# Patient Record
Sex: Female | Born: 1990 | Race: Black or African American | Hispanic: No | Marital: Single | Smoking: Former smoker
Health system: Southern US, Community
[De-identification: ages and names within clinical notes are randomized; demographics above are authoritative.]

## PROBLEM LIST (undated history)

## (undated) DIAGNOSIS — A749 Chlamydial infection, unspecified: Secondary | ICD-10-CM

---

## 2011-02-21 ENCOUNTER — Inpatient Hospital Stay (INDEPENDENT_AMBULATORY_CARE_PROVIDER_SITE_OTHER)
Admission: RE | Admit: 2011-02-21 | Discharge: 2011-02-21 | Disposition: A | Discharge status: Home / Self Care | Source: Ambulatory Visit | Attending: Family Medicine | Admitting: Family Medicine

## 2011-02-21 DIAGNOSIS — G44209 Tension-type headache, unspecified, not intractable: Secondary | ICD-10-CM

## 2012-01-10 ENCOUNTER — Emergency Department (INDEPENDENT_AMBULATORY_CARE_PROVIDER_SITE_OTHER)
Admission: EM | Admit: 2012-01-10 | Discharge: 2012-01-10 | Disposition: A | Discharge status: Home / Self Care | Source: Home / Self Care | Attending: Emergency Medicine | Admitting: Emergency Medicine

## 2012-01-10 ENCOUNTER — Encounter (HOSPITAL_COMMUNITY): Payer: Self-pay | Admitting: Emergency Medicine

## 2012-01-10 DIAGNOSIS — J069 Acute upper respiratory infection, unspecified: Secondary | ICD-10-CM

## 2012-01-10 LAB — POCT PREGNANCY, URINE: Preg Test, Ur: NEGATIVE

## 2012-01-10 MED ORDER — BENZONATATE 200 MG PO CAPS: 200.0000 mg | ORAL_CAPSULE | Freq: Three times a day (TID) | ORAL | Status: AC | PRN

## 2012-01-10 MED ORDER — NAPROXEN 500 MG PO TABS: 500.0000 mg | ORAL_TABLET | Freq: Two times a day (BID) | ORAL | Status: DC

## 2012-01-10 MED ORDER — FEXOFENADINE-PSEUDOEPHED ER 60-120 MG PO TB12: 1.0000 | ORAL_TABLET | Freq: Two times a day (BID) | ORAL | Status: DC

## 2012-01-10 NOTE — ED Provider Notes (Signed)
Chief Complaint  Patient presents with  . Sore Throat    flu like symptoms    History of Present Illness:   The patient is a 21 year old female with a four-day history of sore throat, hoarseness, dry cough, chest tightness, chest pain, nasal congestion, headache, watery eyes, ringing in ears, chills, has felt feverish, weakness, malaise, diarrhea, and nausea.  Review of Systems:  Other than noted above, the patient denies any of the following symptoms. Systemic:  No fever, chills, sweats, fatigue, myalgias, headache, or anorexia. Eye:  No redness, pain or drainage. ENT:  No earache, ear congestion, nasal congestion, sneezing, rhinorrhea, sinus pressure, sinus pain, post nasal drip, or sore throat. Lungs:  No cough, sputum production, wheezing, shortness of breath, or chest pain. GI:  No abdominal pain, nausea, vomiting, or diarrhea.  PMFSH:  Past medical history, family history, social history, meds, and allergies were reviewed.  Physical Exam:   Vital signs:  BP 122/84  Pulse 98  Temp 98.1 F (36.7 C) (Oral)  Resp 16  SpO2 100% General:  Alert, in no distress. Eye:  No conjunctival injection or drainage. Lids were normal. ENT:  TMs and canals were normal, without erythema or inflammation.  Nasal mucosa was congested and, without drainage.  Mucous membranes were moist.  Pharynx was erythematous, without exudate or drainage.  There were no oral ulcerations or lesions. Neck:  Supple, no adenopathy, tenderness or mass. Lungs:  No respiratory distress.  Lungs were clear to auscultation, without wheezes, rales or rhonchi.  Breath sounds were clear and equal bilaterally.  Heart:  Regular rhythm, without gallops, murmers or rubs. Skin:  Clear, warm, and dry, without rash or lesions.  Labs:   Results for orders placed during the hospital encounter of 01/10/12  POCT PREGNANCY, URINE      Component Value Range   Preg Test, Ur NEGATIVE  NEGATIVE   Assessment:  The encounter diagnosis was  Viral upper respiratory infection.  Plan:   1.  The following meds were prescribed:   New Prescriptions   BENZONATATE (TESSALON) 200 MG CAPSULE    Take 1 capsule (200 mg total) by mouth 3 (three) times daily as needed for cough.   FEXOFENADINE-PSEUDOEPHEDRINE (ALLEGRA-D) 60-120 MG PER TABLET    Take 1 tablet by mouth every 12 (twelve) hours.   NAPROXEN (NAPROSYN) 500 MG TABLET    Take 1 tablet (500 mg total) by mouth 2 (two) times daily.   2.  The patient was instructed in symptomatic care and handouts were given. 3.  The patient was told to return if becoming worse in any way, if no better in 3 or 4 days, and given some red flag symptoms that would indicate earlier return.   Reuben Likes, MD 01/10/12 [phone]

## 2012-01-10 NOTE — ED Notes (Addendum)
Pt c/o sore throat/cough since Monday with flu like symptoms/hot cold chills and diarrhea x 3 days. Denies n/v. Pt states she has tried mucinex and nyquil with no relief of symptoms.

## 2012-07-22 ENCOUNTER — Emergency Department (HOSPITAL_COMMUNITY)

## 2012-07-22 ENCOUNTER — Encounter (HOSPITAL_COMMUNITY): Payer: Self-pay | Admitting: *Deleted

## 2012-07-22 ENCOUNTER — Emergency Department (HOSPITAL_COMMUNITY)
Admission: EM | Admit: 2012-07-22 | Discharge: 2012-07-22 | Disposition: A | Discharge status: Home / Self Care | Attending: Emergency Medicine | Admitting: Emergency Medicine

## 2012-07-22 DIAGNOSIS — O200 Threatened abortion: Secondary | ICD-10-CM

## 2012-07-22 DIAGNOSIS — Z3201 Encounter for pregnancy test, result positive: Secondary | ICD-10-CM | POA: Insufficient documentation

## 2012-07-22 DIAGNOSIS — R300 Dysuria: Secondary | ICD-10-CM | POA: Insufficient documentation

## 2012-07-22 DIAGNOSIS — R110 Nausea: Secondary | ICD-10-CM | POA: Insufficient documentation

## 2012-07-22 DIAGNOSIS — Z792 Long term (current) use of antibiotics: Secondary | ICD-10-CM | POA: Insufficient documentation

## 2012-07-22 DIAGNOSIS — O9989 Other specified diseases and conditions complicating pregnancy, childbirth and the puerperium: Secondary | ICD-10-CM | POA: Insufficient documentation

## 2012-07-22 DIAGNOSIS — Z3490 Encounter for supervision of normal pregnancy, unspecified, unspecified trimester: Secondary | ICD-10-CM

## 2012-07-22 LAB — URINALYSIS, ROUTINE W REFLEX MICROSCOPIC
Ketones, ur: 15 mg/dL — AB
Nitrite: NEGATIVE
Protein, ur: NEGATIVE mg/dL
pH: 6.5 (ref 5.0–8.0)

## 2012-07-22 LAB — URINE MICROSCOPIC-ADD ON

## 2012-07-22 LAB — CBC WITH DIFFERENTIAL/PLATELET
HCT: 37.6 % (ref 36.0–46.0)
Hemoglobin: 13.3 g/dL (ref 12.0–15.0)
Lymphocytes Relative: 13 % (ref 12–46)
Lymphs Abs: 1.3 10*3/uL (ref 0.7–4.0)
MCHC: 35.4 g/dL (ref 30.0–36.0)
Monocytes Absolute: 1 10*3/uL (ref 0.1–1.0)
Monocytes Relative: 10 % (ref 3–12)
Neutro Abs: 7.5 10*3/uL (ref 1.7–7.7)
Neutrophils Relative %: 75 % (ref 43–77)
RBC: 4.1 MIL/uL (ref 3.87–5.11)
WBC: 10.1 10*3/uL (ref 4.0–10.5)

## 2012-07-22 LAB — WET PREP, GENITAL
Clue Cells Wet Prep HPF POC: NONE SEEN
Trich, Wet Prep: NONE SEEN

## 2012-07-22 LAB — ABO/RH: ABO/RH(D): B POS

## 2012-07-22 MED ORDER — ONDANSETRON 4 MG PO TBDP
4.0000 mg | ORAL_TABLET | Freq: Three times a day (TID) | ORAL | Status: DC | PRN
Start: 2012-07-22 — End: 2013-07-16

## 2012-07-22 MED ORDER — AZITHROMYCIN 1 G PO PACK
1.0000 g | PACK | Freq: Once | ORAL | Status: AC
  Administered 2012-07-22: 1 g via ORAL
  Filled 2012-07-22: qty 1

## 2012-07-22 MED ORDER — ONDANSETRON 4 MG PO TBDP
4.0000 mg | ORAL_TABLET | Freq: Once | ORAL | Status: AC
  Administered 2012-07-22: 4 mg via ORAL
  Filled 2012-07-22: qty 1

## 2012-07-22 MED ORDER — LIDOCAINE HCL (PF) 1 % IJ SOLN
INTRAMUSCULAR | Status: AC
  Administered 2012-07-22: 17:00:00
  Filled 2012-07-22: qty 5

## 2012-07-22 MED ORDER — ONDANSETRON HCL 4 MG/2ML IJ SOLN
4.0000 mg | Freq: Once | INTRAMUSCULAR | Status: DC
  Filled 2012-07-22: qty 2

## 2012-07-22 MED ORDER — CEFTRIAXONE SODIUM 250 MG IJ SOLR
250.0000 mg | Freq: Once | INTRAMUSCULAR | Status: AC
  Administered 2012-07-22: 250 mg via INTRAMUSCULAR
  Filled 2012-07-22: qty 250

## 2012-07-22 NOTE — ED Notes (Addendum)
Pt jumped out of bed and noticed small brown-red clumps of what she thinks is blood.  Pt now wearing panty-liner.  Pt is being tx for chlamydia and a bacterial vaginosis.  Pt stopped taking antibiotic last night b/c it was giving her yeast-infection-like s/s.  Denies abd pain/cramping.

## 2012-07-22 NOTE — ED Provider Notes (Signed)
History     CSN: [id number]  Arrival date & time 07/22/12  1258   First MD Initiated Contact with Patient 07/22/12 1311      Chief Complaint  Patient presents with  . Vaginal Bleeding    [redacted] weeks pregnant   HPI  22 y/o G1P0 female with no significant PMH who presents with cc of vaginal bleeding. Symptoms began this morning. She states she has noticed reddish-brown clumps as well as "white streaks". She has used one panty liner. She is currently being treated for "chlamydia" and was taking amoxicillin until today when she stopped taking it because she was having symptoms of a yeast infection. She denies abdominal pain or cramping. She denies recent intercourse. LMP- 05/20/12  History reviewed. No pertinent past medical history.  History reviewed. No pertinent past surgical history.  Family History  Problem Relation Age of Onset  . Diabetes Other     History  Substance Use Topics  . Smoking status: Former Games developer  . Smokeless tobacco: Not on file  . Alcohol Use: 0.0 oz/week    1-2 Glasses of wine per week     Comment: not while pregnant    OB History   Grav Para Term Preterm Abortions TAB SAB Ect Mult Living                 Review of Systems  Constitutional: Negative for fever and chills.  HENT: Negative for congestion and rhinorrhea.   Respiratory: Negative for shortness of breath.   Cardiovascular: Negative for chest pain.  Gastrointestinal: Positive for nausea. Negative for vomiting and abdominal pain.  Genitourinary: Positive for dysuria, vaginal bleeding and vaginal discharge. Negative for frequency.  Skin: Negative for rash.  All other systems reviewed and are negative.   Allergies  Zithromax  Home Medications   Current Outpatient Rx  Name  Route  Sig  Dispense  Refill  . amoxicillin (AMOXIL) 500 MG capsule   Oral   Take 500 mg by mouth 3 (three) times daily. For 7 days. Started 07/16/2012         . metroNIDAZOLE (FLAGYL) 500 MG tablet   Oral   Take  500 mg by mouth 2 (two) times daily.         . Prenatal Vit-Fe Fumarate-FA (MULTIVITAMIN-PRENATAL) 27-0.8 MG TABS   Oral   Take 1 tablet by mouth daily at 12 noon.           BP 136/76  Pulse 90  Temp(Src) 97.4 F (36.3 C) (Oral)  Resp 18  SpO2 96%  LMP 05/20/2012  Physical Exam  Nursing note and vitals reviewed. Constitutional: She is oriented to person, place, and time. She appears well-developed and well-nourished. No distress.  HENT:  Head: Normocephalic and atraumatic.  Mouth/Throat: No oropharyngeal exudate.  Eyes: Conjunctivae are normal. Pupils are equal, round, and reactive to light.  Neck: Normal range of motion. Neck supple.  Cardiovascular: Normal rate.  Exam reveals no gallop and no friction rub.   No murmur heard. Pulmonary/Chest: Effort normal and breath sounds normal.  Abdominal: Soft. Bowel sounds are normal.  Genitourinary: Cervix exhibits no motion tenderness and no discharge. Right adnexum displays no mass and no tenderness. Left adnexum displays no mass and no tenderness. There is bleeding around the vagina. No tenderness around the vagina.  Pelvic performed in presence of nurse chaperone. Mild uterine tenderness. Os closed. Scant blood in vaginal vault.   Musculoskeletal: She exhibits no edema and no tenderness.  Neurological: She is  alert and oriented to person, place, and time.  Skin: Skin is warm and dry.  Psychiatric: She has a normal mood and affect.    ED Course  Procedures (including critical care time)  Labs Reviewed  WET PREP, GENITAL - Abnormal; Notable for the following:    Yeast Wet Prep HPF POC FEW (*)    WBC, Wet Prep HPF POC TOO NUMEROUS TO COUNT (*)    All other components within normal limits  URINALYSIS, ROUTINE W REFLEX MICROSCOPIC - Abnormal; Notable for the following:    Color, Urine AMBER (*)    Specific Gravity, Urine 1.031 (*)    Bilirubin Urine SMALL (*)    Ketones, ur 15 (*)    Leukocytes, UA SMALL (*)    All other  components within normal limits  GC/CHLAMYDIA PROBE AMP  URINE CULTURE  CBC WITH DIFFERENTIAL  URINE MICROSCOPIC-ADD ON  HCG, QUANTITATIVE, PREGNANCY  ABO/RH   US Ob Comp Less 14 Wks  07/22/2012  *RADIOLOGY REPORT*  Clinical Data: Pregnant, vaginal bleeding  OBSTETRIC <14 WK [place] AND TRANSVAGINAL OB US  Technique:  Both transabdominal and transvaginal ultrasound examinations were performed for complete evaluation of the gestation as well as the maternal uterus, adnexal regions, and pelvic cul-de-sac.  Transvaginal technique was performed to assess early pregnancy.  Comparison:  None.  Intrauterine gestational sac:  Visualized/normal in shape. Yolk sac: Present Embryo: Present Cardiac Activity: Present Heart Rate: 189 bpm  CRL: 21  mm  8 w  5 d         [place] EDC: 02/26/2013  Maternal uterus/adnexae: No subchronic hemorrhage.  Suspected 3.4 x 2.6 x 2.8 cm subserosal uterine body fibroid.  Right ovary is within normal limits transabdominally, measuring 1.3 x 2.7 x 0.9 cm.  Left ovary measures 2.1 x 2.8 x 2.0 cm and is notable for a 2.0 cm corpus luteal cyst.  No free fluid.  IMPRESSION: Single live intrauterine gestation with estimated gestational age [redacted] weeks 5 days by crown-rump length.   Original Report Authenticated By: Charline Bills, M.D.    US Ob Transvaginal  07/22/2012  *RADIOLOGY REPORT*  Clinical Data: Pregnant, vaginal bleeding  OBSTETRIC <14 WK [place] AND TRANSVAGINAL OB US  Technique:  Both transabdominal and transvaginal ultrasound examinations were performed for complete evaluation of the gestation as well as the maternal uterus, adnexal regions, and pelvic cul-de-sac.  Transvaginal technique was performed to assess early pregnancy.  Comparison:  None.  Intrauterine gestational sac:  Visualized/normal in shape. Yolk sac: Present Embryo: Present Cardiac Activity: Present Heart Rate: 189 bpm  CRL: 21  mm  8 w  5 d         [place] EDC: 02/26/2013  Maternal uterus/adnexae: No subchronic hemorrhage.   Suspected 3.4 x 2.6 x 2.8 cm subserosal uterine body fibroid.  Right ovary is within normal limits transabdominally, measuring 1.3 x 2.7 x 0.9 cm.  Left ovary measures 2.1 x 2.8 x 2.0 cm and is notable for a 2.0 cm corpus luteal cyst.  No free fluid.  IMPRESSION: Single live intrauterine gestation with estimated gestational age [redacted] weeks 5 days by crown-rump length.   Original Report Authenticated By: Charline Bills, M.D.     No diagnosis found.  MDM   22 y/o G1P0 female with no significant PMH who presents with cc of vaginal bleeding. HDS. Currently [redacted] weeks pregnant. Exam concerning for possible SAB. Will obtain US, CBC, and RH status.   [place] with live IUP. Hemoglobin stable. RH positive. UA  not c/w UTI but is currently on amoxicillin. The patient states she was told she had chlamydia but states she never received a shot or azithromycin. Because the patient states she was told she had chlamydia will empirically treat at this time. No evidence of PID on exam. The patient was given 1 g of azithromycin and 250 mg of rocephin. The patient was instructed to follow up with her ob/gyn.        Shanon Ace, MD 07/23/12 [phone]

## 2012-07-23 LAB — GC/CHLAMYDIA PROBE AMP
CT Probe RNA: POSITIVE — AB
GC Probe RNA: NEGATIVE

## 2012-07-23 LAB — URINE CULTURE: Colony Count: NO GROWTH

## 2012-07-25 NOTE — ED Notes (Signed)
+  Chlamydia. Patient treated with Rocephin and Zithromax. [employer] faxed. 

## 2012-07-26 ENCOUNTER — Telehealth (HOSPITAL_COMMUNITY): Payer: Self-pay | Admitting: Emergency Medicine

## 2012-07-26 NOTE — ED Notes (Signed)
Unable to reach patient via phone. Sent letter.

## 2012-07-26 NOTE — ED Provider Notes (Signed)
I have personally seen and examined the patient.  I have discussed the plan of care with the resident.  I have reviewed the documentation on PMH/FH/Soc. History.  I have reviewed the documentation of the resident and agree.   Pt well appearing, abdomen soft on my exam.   Will need outpatient GYN followup Stable for d/c  Joya Gaskins, MD 07/26/12 [phone]

## 2012-08-12 ENCOUNTER — Telehealth (HOSPITAL_COMMUNITY): Payer: Self-pay | Admitting: Emergency Medicine

## 2012-08-12 NOTE — ED Notes (Signed)
Pt calling after receiving letter requesting callback.  ID verified x 2.  Pt informed of dx, tx rvcd approp. notify partner(s) for testing and tx and abstain from sex 2 wks post tx.

## 2013-07-16 ENCOUNTER — Encounter (HOSPITAL_COMMUNITY): Payer: Self-pay | Admitting: Emergency Medicine

## 2013-07-16 ENCOUNTER — Emergency Department (HOSPITAL_COMMUNITY)
Admission: EM | Admit: 2013-07-16 | Discharge: 2013-07-16 | Disposition: A | Discharge status: Hospice | Source: Home / Self Care | Attending: Family Medicine | Admitting: Family Medicine

## 2013-07-16 ENCOUNTER — Emergency Department (HOSPITAL_COMMUNITY)
Admission: EM | Admit: 2013-07-16 | Discharge: 2013-07-16 | Discharge status: Against Medical Advice | Attending: Emergency Medicine | Admitting: Emergency Medicine

## 2013-07-16 DIAGNOSIS — R197 Diarrhea, unspecified: Secondary | ICD-10-CM | POA: Insufficient documentation

## 2013-07-16 DIAGNOSIS — R1011 Right upper quadrant pain: Secondary | ICD-10-CM | POA: Insufficient documentation

## 2013-07-16 HISTORY — DX: Chlamydial infection, unspecified: A74.9

## 2013-07-16 LAB — CBC WITH DIFFERENTIAL/PLATELET
BASOS PCT: 1 % (ref 0–1)
Basophils Absolute: 0.1 10*3/uL (ref 0.0–0.1)
Eosinophils Absolute: 0.2 10*3/uL (ref 0.0–0.7)
Eosinophils Relative: 2 % (ref 0–5)
HCT: 37.2 % (ref 36.0–46.0)
Hemoglobin: 13.1 g/dL (ref 12.0–15.0)
LYMPHS PCT: 22 % (ref 12–46)
Lymphs Abs: 2.4 10*3/uL (ref 0.7–4.0)
MCH: 33.4 pg (ref 26.0–34.0)
MCHC: 35.2 g/dL (ref 30.0–36.0)
MCV: 94.9 fL (ref 78.0–100.0)
Monocytes Absolute: 1.4 10*3/uL — ABNORMAL HIGH (ref 0.1–1.0)
Monocytes Relative: 13 % — ABNORMAL HIGH (ref 3–12)
NEUTROS ABS: 6.9 10*3/uL (ref 1.7–7.7)
NEUTROS PCT: 64 % (ref 43–77)
PLATELETS: 339 10*3/uL (ref 150–400)
RBC: 3.92 MIL/uL (ref 3.87–5.11)
RDW: 12.3 % (ref 11.5–15.5)
WBC: 10.9 10*3/uL — AB (ref 4.0–10.5)

## 2013-07-16 LAB — POCT PREGNANCY, URINE: Preg Test, Ur: NEGATIVE

## 2013-07-16 LAB — POCT URINALYSIS DIP (DEVICE)
BILIRUBIN URINE: NEGATIVE
Glucose, UA: NEGATIVE mg/dL
Hgb urine dipstick: NEGATIVE
Ketones, ur: 40 mg/dL — AB
NITRITE: NEGATIVE
PH: 5.5 (ref 5.0–8.0)
PROTEIN: NEGATIVE mg/dL
Specific Gravity, Urine: >=1.03 (ref 1.005–1.030)
UROBILINOGEN UA: 0.2 mg/dL (ref 0.0–1.0)

## 2013-07-16 LAB — COMPREHENSIVE METABOLIC PANEL
ALK PHOS: 69 U/L (ref 39–117)
ALT: 18 U/L (ref 0–35)
AST: 18 U/L (ref 0–37)
Albumin: 3.7 g/dL (ref 3.5–5.2)
BILIRUBIN TOTAL: 0.7 mg/dL (ref 0.3–1.2)
BUN: 9 mg/dL (ref 6–23)
CO2: 24 meq/L (ref 19–32)
Calcium: 9.6 mg/dL (ref 8.4–10.5)
Chloride: 99 mEq/L (ref 96–112)
Creatinine, Ser: 0.64 mg/dL (ref 0.50–1.10)
GLUCOSE: 84 mg/dL (ref 70–99)
POTASSIUM: 3.9 meq/L (ref 3.7–5.3)
SODIUM: 141 meq/L (ref 137–147)
Total Protein: 7.9 g/dL (ref 6.0–8.3)

## 2013-07-16 LAB — LIPASE, BLOOD: LIPASE: 24 U/L (ref 11–59)

## 2013-07-16 NOTE — ED Notes (Signed)
Pt updated on delay; family understanding. Revitaled.

## 2013-07-16 NOTE — ED Provider Notes (Addendum)
CSN: [id number]     Arrival date & time 07/16/13  1707 History   None    Chief Complaint  Patient presents with  . Abdominal Pain   (Consider location/radiation/quality/duration/timing/severity/associated sxs/prior Treatment) Patient is a 23 y.o. female presenting with abdominal pain. The history is provided by the patient.  Abdominal Pain Pain location:  RUQ Pain quality: cramping and shooting   Pain radiates to:  Does not radiate Pain severity:  Moderate Onset quality:  Gradual Duration:  2 weeks Timing:  Constant Chronicity:  New Context comment:  5 mo postpartum Associated symptoms: diarrhea   Associated symptoms: no nausea, no vaginal bleeding, no vaginal discharge and no vomiting   Associated symptoms comment:  Recent rx for bv.- resolved.   Past Medical History  Diagnosis Date  . Chlamydia    History reviewed. No pertinent past surgical history. Family History  Problem Relation Age of Onset  . Diabetes Other    History  Substance Use Topics  . Smoking status: Former Games developer  . Smokeless tobacco: Not on file  . Alcohol Use: 0.0 oz/week    1-2 Glasses of wine per week     Comment: occ.   OB History   Grav Para Term Preterm Abortions TAB SAB Ect Mult Living                 Review of Systems  Constitutional: Negative.   Gastrointestinal: Positive for abdominal pain and diarrhea. Negative for nausea and vomiting.  Genitourinary: Negative for vaginal bleeding, vaginal discharge and vaginal pain.    Allergies  Zithromax  Home Medications   Current Outpatient Rx  Name  Route  Sig  Dispense  Refill  . doxycycline (MONODOX) 100 MG capsule   Oral   Take 100 mg by mouth 2 (two) times daily.         . metroNIDAZOLE (FLAGYL) 500 MG tablet   Oral   Take 500 mg by mouth 2 (two) times daily.         . Norethindrone-Ethinyl Estradiol-Fe Biphas (LO LOESTRIN FE) 1 MG-10 MCG / 10 MCG tablet   Oral   Take 1 tablet by mouth daily.         . Prenatal Vit-Fe  Fumarate-FA (MULTIVITAMIN-PRENATAL) 27-0.8 MG TABS   Oral   Take 1 tablet by mouth daily at 12 noon.          BP 129/81  Pulse 108  Temp(Src) 98.4 F (36.9 C) (Oral)  Resp 18  SpO2 97%  LMP 06/16/2013 Physical Exam  Nursing note and vitals reviewed. Constitutional: She is oriented to person, place, and time. She appears well-developed and well-nourished.  HENT:  Mouth/Throat: Oropharynx is clear and moist.  Neck: Normal range of motion. Neck supple.  Abdominal: Soft. She exhibits no distension and no mass. Bowel sounds are decreased. There is no hepatosplenomegaly. There is tenderness in the right upper quadrant. There is positive Murphy's sign. There is no rigidity, no rebound, no guarding, no CVA tenderness and no tenderness at McBurney's point.    Lymphadenopathy:    She has no cervical adenopathy.  Neurological: She is alert and oriented to person, place, and time.  Skin: Skin is warm and dry.    ED Course  Procedures (including critical care time) Labs Review Labs Reviewed  POCT URINALYSIS DIP (DEVICE) - Abnormal; Notable for the following:    Ketones, ur 40 (*)    Leukocytes, UA TRACE (*)    All other components within normal limits  POCT PREGNANCY, URINE   Imaging Review No results found.   MDM   1. RUQ abdominal pain        Linna HoffJames D Bekki Tavenner, MD 07/16/13 1816  Linna HoffJames D Neda Willenbring, MD 07/16/13 2049

## 2013-07-16 NOTE — ED Notes (Addendum)
Pt c/o pain in RUQ than started 10 days ago.  Pt states pain is constant, worse at night.  Pt stats she can't sleep or lay on L side- must lay on right.  Pt states pain worsens with deep inhalation. Pt states diarrhea x3days

## 2013-07-16 NOTE — ED Notes (Signed)
C/o abd pain for a week  States she has more pain at night when she lays down Admits to having a STD States the pain is only on right side States she is having diarrhea

## 2013-07-17 ENCOUNTER — Encounter (HOSPITAL_COMMUNITY): Payer: Self-pay | Admitting: Emergency Medicine

## 2013-07-17 ENCOUNTER — Emergency Department (HOSPITAL_COMMUNITY)

## 2013-07-17 ENCOUNTER — Emergency Department (HOSPITAL_COMMUNITY)
Admission: EM | Admit: 2013-07-17 | Discharge: 2013-07-17 | Disposition: A | Discharge status: Home / Self Care | Attending: Emergency Medicine | Admitting: Emergency Medicine

## 2013-07-17 DIAGNOSIS — R109 Unspecified abdominal pain: Secondary | ICD-10-CM

## 2013-07-17 DIAGNOSIS — N898 Other specified noninflammatory disorders of vagina: Secondary | ICD-10-CM | POA: Insufficient documentation

## 2013-07-17 DIAGNOSIS — R197 Diarrhea, unspecified: Secondary | ICD-10-CM | POA: Insufficient documentation

## 2013-07-17 DIAGNOSIS — R110 Nausea: Secondary | ICD-10-CM | POA: Insufficient documentation

## 2013-07-17 DIAGNOSIS — R1011 Right upper quadrant pain: Secondary | ICD-10-CM | POA: Insufficient documentation

## 2013-07-17 DIAGNOSIS — Z87891 Personal history of nicotine dependence: Secondary | ICD-10-CM | POA: Insufficient documentation

## 2013-07-17 DIAGNOSIS — Z3202 Encounter for pregnancy test, result negative: Secondary | ICD-10-CM | POA: Insufficient documentation

## 2013-07-17 DIAGNOSIS — R1032 Left lower quadrant pain: Secondary | ICD-10-CM | POA: Insufficient documentation

## 2013-07-17 DIAGNOSIS — Z79899 Other long term (current) drug therapy: Secondary | ICD-10-CM | POA: Insufficient documentation

## 2013-07-17 DIAGNOSIS — Z8619 Personal history of other infectious and parasitic diseases: Secondary | ICD-10-CM | POA: Insufficient documentation

## 2013-07-17 LAB — COMPREHENSIVE METABOLIC PANEL
ALK PHOS: 73 U/L (ref 39–117)
ALT: 18 U/L (ref 0–35)
AST: 19 U/L (ref 0–37)
Albumin: 3.6 g/dL (ref 3.5–5.2)
BILIRUBIN TOTAL: 0.9 mg/dL (ref 0.3–1.2)
BUN: 9 mg/dL (ref 6–23)
CO2: 26 mEq/L (ref 19–32)
Calcium: 9.8 mg/dL (ref 8.4–10.5)
Chloride: 102 mEq/L (ref 96–112)
Creatinine, Ser: 0.7 mg/dL (ref 0.50–1.10)
GFR calc Af Amer: >90 mL/min (ref >90)
GFR calc non Af Amer: >90 mL/min (ref >90)
Glucose, Bld: 91 mg/dL (ref 70–99)
Potassium: 4.3 mEq/L (ref 3.7–5.3)
SODIUM: 143 meq/L (ref 137–147)
Total Protein: 8.5 g/dL — ABNORMAL HIGH (ref 6.0–8.3)

## 2013-07-17 LAB — URINALYSIS, ROUTINE W REFLEX MICROSCOPIC
Glucose, UA: NEGATIVE mg/dL
Hgb urine dipstick: NEGATIVE
Ketones, ur: 15 mg/dL — AB
NITRITE: POSITIVE — AB
PROTEIN: 30 mg/dL — AB
Specific Gravity, Urine: 1.041 — ABNORMAL HIGH (ref 1.005–1.030)
Urobilinogen, UA: 1 mg/dL (ref 0.0–1.0)
pH: 5.5 (ref 5.0–8.0)

## 2013-07-17 LAB — CBC WITH DIFFERENTIAL/PLATELET
BASOS PCT: 0 % (ref 0–1)
Basophils Absolute: 0 10*3/uL (ref 0.0–0.1)
EOS ABS: 0.1 10*3/uL (ref 0.0–0.7)
Eosinophils Relative: 2 % (ref 0–5)
HCT: 40.5 % (ref 36.0–46.0)
HEMOGLOBIN: 13.9 g/dL (ref 12.0–15.0)
LYMPHS ABS: 1 10*3/uL (ref 0.7–4.0)
Lymphocytes Relative: 13 % (ref 12–46)
MCH: 32.6 pg (ref 26.0–34.0)
MCHC: 34.3 g/dL (ref 30.0–36.0)
MCV: 94.8 fL (ref 78.0–100.0)
MONOS PCT: 12 % (ref 3–12)
Monocytes Absolute: 0.8 10*3/uL (ref 0.1–1.0)
NEUTROS ABS: 5.2 10*3/uL (ref 1.7–7.7)
NEUTROS PCT: 73 % (ref 43–77)
PLATELETS: 363 10*3/uL (ref 150–400)
RBC: 4.27 MIL/uL (ref 3.87–5.11)
RDW: 12.3 % (ref 11.5–15.5)
WBC: 7.1 10*3/uL (ref 4.0–10.5)

## 2013-07-17 LAB — LIPASE, BLOOD: Lipase: 29 U/L (ref 11–59)

## 2013-07-17 LAB — URINE MICROSCOPIC-ADD ON

## 2013-07-17 LAB — WET PREP, GENITAL
CLUE CELLS WET PREP: NONE SEEN
Trich, Wet Prep: NONE SEEN
Yeast Wet Prep HPF POC: NONE SEEN

## 2013-07-17 LAB — POC URINE PREG, ED: PREG TEST UR: NEGATIVE

## 2013-07-17 MED ORDER — PANTOPRAZOLE SODIUM 20 MG PO TBEC: 20.0000 mg | DELAYED_RELEASE_TABLET | Freq: Every day | ORAL | Status: AC

## 2013-07-17 MED ORDER — DOXYCYCLINE HYCLATE 100 MG PO CAPS: 100.0000 mg | ORAL_CAPSULE | Freq: Two times a day (BID) | ORAL | Status: AC

## 2013-07-17 MED ORDER — GI COCKTAIL ~~LOC~~: 30.0000 mL | Freq: Once | ORAL | Status: DC

## 2013-07-17 MED ORDER — OXYCODONE-ACETAMINOPHEN 5-325 MG PO TABS
2.0000 | ORAL_TABLET | Freq: Once | ORAL | Status: AC
  Administered 2013-07-17: 2 via ORAL
  Filled 2013-07-17: qty 2

## 2013-07-17 MED ORDER — CEFTRIAXONE SODIUM 250 MG IJ SOLR: 250.0000 mg | Freq: Once | INTRAMUSCULAR | Status: DC

## 2013-07-17 MED ORDER — ONDANSETRON 4 MG PO TBDP
8.0000 mg | ORAL_TABLET | Freq: Once | ORAL | Status: AC
  Administered 2013-07-17: 8 mg via ORAL
  Filled 2013-07-17: qty 2

## 2013-07-17 NOTE — ED Provider Notes (Signed)
CSN: [id number]     Arrival date & time 07/17/13  0915 History   First MD Initiated Contact with Patient 07/17/13 0930     Chief Complaint  Patient presents with  . Abdominal Pain     (Consider location/radiation/quality/duration/timing/severity/associated sxs/prior Treatment) HPI Comments: Patient is a 23 year old G1P1 female with history of chlamydia who presents today with 10 days of gradually worsening right upper quadrant pain. She reports that the pain is constant and associated with nausea. The pain is worse with laying down. She states that food does not seem to aggravate this pain. She generally eats vegetables and fruit. She has not been eating fried or fatty foods over the past few weeks. She has also had some associated diarrhea. She was seen a few days ago by her primary care doctor and diagnosed with a "bacterial infection" in her pelvis. She does not remember which infection this was or what antibiotic she was placed on. She stopped taking the antibiotics early because her stomach hurt. She denies any abnormal vaginal discharge or vaginal bleeding. No fevers, chills, shortness of breath, chest pain. She has not had any prior abdominal surgeries in the past.   Patient is a 23 y.o. female presenting with abdominal pain. The history is provided by the patient. No language interpreter was used.  Abdominal Pain Associated symptoms: diarrhea, nausea and vaginal discharge   Associated symptoms: no chest pain, no chills, no dysuria, no fever, no shortness of breath, no vaginal bleeding and no vomiting     Past Medical History  Diagnosis Date  . Chlamydia    History reviewed. No pertinent past surgical history. Family History  Problem Relation Age of Onset  . Diabetes Other    History  Substance Use Topics  . Smoking status: Former Games developer  . Smokeless tobacco: Not on file  . Alcohol Use: 0.0 oz/week    1-2 Glasses of wine per week     Comment: occ.   OB History   Grav Para  Term Preterm Abortions TAB SAB Ect Mult Living                 Review of Systems  Constitutional: Negative for fever and chills.  Respiratory: Negative for shortness of breath.   Cardiovascular: Negative for chest pain.  Gastrointestinal: Positive for nausea, abdominal pain and diarrhea. Negative for vomiting and blood in stool.  Genitourinary: Positive for vaginal discharge and pelvic pain. Negative for dysuria, urgency, frequency and vaginal bleeding.  All other systems reviewed and are negative.      Allergies  Zithromax  Home Medications   Current Outpatient Rx  Name  Route  Sig  Dispense  Refill  . Norethindrone-Ethinyl Estradiol-Fe Biphas (LO LOESTRIN FE) 1 MG-10 MCG / 10 MCG tablet   Oral   Take 1 tablet by mouth daily.         . Prenatal Vit-Fe Fumarate-FA (MULTIVITAMIN-PRENATAL) 27-0.8 MG TABS   Oral   Take 1 tablet by mouth daily at 12 noon.         Marland Kitchen doxycycline (MONODOX) 100 MG capsule   Oral   Take 100 mg by mouth 2 (two) times daily.         . metroNIDAZOLE (FLAGYL) 500 MG tablet   Oral   Take 500 mg by mouth 2 (two) times daily.          BP 104/61  Pulse 94  Temp(Src) 98.2 F (36.8 C) (Oral)  Resp 18  SpO2 99%  LMP  06/16/2013 Physical Exam  Nursing note and vitals reviewed. Constitutional: She is oriented to person, place, and time. She appears well-developed and well-nourished.  Non-toxic appearance. She does not have a sickly appearance. She does not appear ill. No distress.  Very well appearing. On phone and playing with son during entire course of ED stay  HENT:  Head: Normocephalic and atraumatic.  Right Ear: External ear normal.  Left Ear: External ear normal.  Nose: Nose normal.  Mouth/Throat: Oropharynx is clear and moist and mucous membranes are normal. Mucous membranes are not dry.  Eyes: Conjunctivae are normal.  Neck: Normal range of motion.  No nuchal rigidity or meningeal signs  Cardiovascular: Normal rate, regular  rhythm, normal heart sounds, intact distal pulses and normal pulses.   Pulmonary/Chest: Effort normal and breath sounds normal. No stridor. No respiratory distress. She has no wheezes. She has no rales.  Abdominal: Soft. She exhibits no distension. There is tenderness in the right upper quadrant, suprapubic area and left lower quadrant. There is no rigidity, no rebound, no guarding and negative Murphy's sign.  Genitourinary: There is no rash, tenderness, lesion or injury on the right labia. There is no rash, tenderness, lesion or injury on the left labia. Cervix exhibits discharge. Cervix exhibits no motion tenderness. Right adnexum displays no mass, no tenderness and no fullness. Left adnexum displays no mass, no tenderness and no fullness. No erythema, tenderness or bleeding around the vagina. No foreign body around the vagina. No signs of injury around the vagina.  Cervix looks mildly irritated around os. Yellow discharge coming from cervical os. No cervical motion tenderness.   Musculoskeletal: Normal range of motion.  Neurological: She is alert and oriented to person, place, and time. She has normal strength.  Skin: Skin is warm and dry. She is not diaphoretic. No erythema.  Psychiatric: She has a normal mood and affect. Her behavior is normal.    ED Course  Procedures (including critical care time) Labs Review Labs Reviewed  WET PREP, GENITAL - Abnormal; Notable for the following:    WBC, Wet Prep HPF POC TOO NUMEROUS TO COUNT (*)    All other components within normal limits  COMPREHENSIVE METABOLIC PANEL - Abnormal; Notable for the following:    Total Protein 8.5 (*)    All other components within normal limits  URINALYSIS, ROUTINE W REFLEX MICROSCOPIC - Abnormal; Notable for the following:    Color, Urine AMBER (*)    APPearance TURBID (*)    Specific Gravity, Urine 1.041 (*)    Bilirubin Urine SMALL (*)    Ketones, ur 15 (*)    Protein, ur 30 (*)    Nitrite POSITIVE (*)     Leukocytes, UA SMALL (*)    All other components within normal limits  URINE MICROSCOPIC-ADD ON - Abnormal; Notable for the following:    Squamous Epithelial / LPF MANY (*)    Bacteria, UA MANY (*)    All other components within normal limits  URINE CULTURE  GC/CHLAMYDIA PROBE AMP  CBC WITH DIFFERENTIAL  LIPASE, BLOOD  HIV ANTIBODY (ROUTINE TESTING)  POC URINE PREG, ED   Imaging Review [place] Abdomen Complete  07/17/2013   CLINICAL DATA:  Pain.  EXAM: ULTRASOUND ABDOMEN COMPLETE  COMPARISON:  None.  FINDINGS: Gallbladder:  No gallstones or wall thickening visualized. No sonographic Murphy sign noted.  Common bile duct:  Diameter: 4.0 mm  Liver:  No focal lesion identified. Within normal limits in parenchymal echogenicity.  IVC:  No abnormality  visualized.  Pancreas:  Visualized portion unremarkable.  Spleen:  Size and appearance within normal limits.  Right Kidney:  Length: 9.9 cm. Echogenicity within normal limits. No mass or hydronephrosis visualized.  Left Kidney:  Length: 9.9 cm. Echogenicity within normal limits. No mass or hydronephrosis visualized.  Abdominal aorta:  No aneurysm visualized.  Other findings:  None.  IMPRESSION: No focal abnormality .   Electronically Signed   By: Maisie Fus  Register   On: 07/17/2013 11:50     EKG Interpretation None      MDM   Final diagnoses:  Abdominal pain    Patient is nontoxic, nonseptic appearing, in no apparent distress.  Patient's pain and other symptoms adequately managed in emergency department.  Labs, imaging and vitals reviewed.  Patient does not meet the SIRS or Sepsis criteria.  On repeat exam patient does not have a surgical abdomen and there are no peritoneal signs.  No indication of appendicitis, bowel obstruction, bowel perforation, cholecystitis, diverticulitis, PID or ectopic pregnancy.  I believe the patient likely has recurrence of her chlamydia. She is allergic to azithromycin and will be given doxycycline. Patient has been non  compliant in the past with doxycycline. Discussed importance of finishing course of abx. Patient expresses understanding. She also understands not to breast feed while taking this antibiotic as it can be transferred through the milk. She additionally has UTI. Culture sent. Will prescribe additional abx if not sensitive to doxycyline.  Patient discharged home with strict instructions for follow-up with their primary care physician.  I have also discussed reasons to return immediately to the ER.  Patient expresses understanding and agrees with plan.     Mora Bellman, PA-C 07/17/13 2014

## 2013-07-17 NOTE — ED Notes (Addendum)
Pt states she was here last night for abd pain and she left before she could see the doctor because she had to wait too long. She states the pain is in her RUQ.  She started talking on the phone during triage

## 2013-07-17 NOTE — ED Provider Notes (Signed)
Medical screening examination/treatment/procedure(s) were performed by non-physician practitioner and as supervising physician I was immediately available for consultation/collaboration.   EKG Interpretation None        Andi Mahaffy E Thurl Boen, MD 07/17/13 2257 

## 2013-07-17 NOTE — Discharge Instructions (Signed)
Abdominal Pain, Women °Abdominal (stomach, pelvic, or belly) pain can be caused by many things. It is important to tell your doctor: °· The location of the pain. °· Does it come and go or is it present all the time? °· Are there things that start the pain (eating certain foods, exercise)? °· Are there other symptoms associated with the pain (fever, nausea, vomiting, diarrhea)? °All of this is helpful to know when trying to find the cause of the pain. °CAUSES  °· Stomach: virus or bacteria infection, or ulcer. °· Intestine: appendicitis (inflamed appendix), regional ileitis (Crohn's disease), ulcerative colitis (inflamed colon), irritable bowel syndrome, diverticulitis (inflamed diverticulum of the colon), or cancer of the stomach or intestine. °· Gallbladder disease or stones in the gallbladder. °· Kidney disease, kidney stones, or infection. °· Pancreas infection or cancer. °· Fibromyalgia (pain disorder). °· Diseases of the female organs: °· Uterus: fibroid (non-cancerous) tumors or infection. °· Fallopian tubes: infection or tubal pregnancy. °· Ovary: cysts or tumors. °· Pelvic adhesions (scar tissue). °· Endometriosis (uterus lining tissue growing in the pelvis and on the pelvic organs). °· Pelvic congestion syndrome (female organs filling up with blood just before the menstrual period). °· Pain with the menstrual period. °· Pain with ovulation (producing an egg). °· Pain with an IUD (intrauterine device, birth control) in the uterus. °· Cancer of the female organs. °· Functional pain (pain not caused by a disease, may improve without treatment). °· Psychological pain. °· Depression. °DIAGNOSIS  °Your doctor will decide the seriousness of your pain by doing an examination. °· Blood tests. °· X-rays. °· Ultrasound. °· CT scan (computed tomography, special type of X-ray). °· MRI (magnetic resonance imaging). °· Cultures, for infection. °· Barium enema (dye inserted in the large intestine, to better view it with  X-rays). °· Colonoscopy (looking in intestine with a lighted tube). °· Laparoscopy (minor surgery, looking in abdomen with a lighted tube). °· Major abdominal exploratory surgery (looking in abdomen with a large incision). °TREATMENT  °The treatment will depend on the cause of the pain.  °· Many cases can be observed and treated at home. °· Over-the-counter medicines recommended by your caregiver. °· Prescription medicine. °· Antibiotics, for infection. °· Birth control pills, for painful periods or for ovulation pain. °· Hormone treatment, for endometriosis. °· Nerve blocking injections. °· Physical therapy. °· Antidepressants. °· Counseling with a psychologist or psychiatrist. °· Minor or major surgery. °HOME CARE INSTRUCTIONS  °· Do not take laxatives, unless directed by your caregiver. °· Take over-the-counter pain medicine only if ordered by your caregiver. Do not take aspirin because it can cause an upset stomach or bleeding. °· Try a clear liquid diet (broth or water) as ordered by your caregiver. Slowly move to a bland diet, as tolerated, if the pain is related to the stomach or intestine. °· Have a thermometer and take your temperature several times a day, and record it. °· Bed rest and sleep, if it helps the pain. °· Avoid sexual intercourse, if it causes pain. °· Avoid stressful situations. °· Keep your follow-up appointments and tests, as your caregiver orders. °· If the pain does not go away with medicine or surgery, you may try: °· Acupuncture. °· Relaxation exercises (yoga, meditation). °· Group therapy. °· Counseling. °SEEK MEDICAL CARE IF:  °· You notice certain foods cause stomach pain. °· Your home care treatment is not helping your pain. °· You need stronger pain medicine. °· You want your IUD removed. °· You feel faint or   lightheaded. °· You develop nausea and vomiting. °· You develop a rash. °· You are having side effects or an allergy to your medicine. °SEEK IMMEDIATE MEDICAL CARE IF:  °· Your  pain does not go away or gets worse. °· You have a fever. °· Your pain is felt only in portions of the abdomen. The right side could possibly be appendicitis. The left lower portion of the abdomen could be colitis or diverticulitis. °· You are passing blood in your stools (bright red or black tarry stools, with or without vomiting). °· You have blood in your urine. °· You develop chills, with or without a fever. °· You pass out. °MAKE SURE YOU:  °· Understand these instructions. °· Will watch your condition. °· Will get help right away if you are not doing well or get worse. °Document Released: 02/18/2007 Document Revised: 07/16/2011 Document Reviewed: 03/10/2009 °[employer]® Patient Information ©2014 [employer]. ° °

## 2013-07-17 NOTE — ED Notes (Signed)
Patient is still off the unit.

## 2013-07-18 LAB — URINE CULTURE
Colony Count: NO GROWTH
Culture: NO GROWTH

## 2013-07-18 LAB — GC/CHLAMYDIA PROBE AMP
CT Probe RNA: POSITIVE — AB
GC PROBE AMP APTIMA: NEGATIVE

## 2013-07-18 LAB — HIV ANTIBODY (ROUTINE TESTING W REFLEX): HIV: NONREACTIVE

## 2013-07-19 ENCOUNTER — Telehealth (HOSPITAL_BASED_OUTPATIENT_CLINIC_OR_DEPARTMENT_OTHER): Payer: Self-pay | Admitting: Emergency Medicine

## 2013-07-19 NOTE — Telephone Encounter (Signed)
Informed patient of +Chlamydia and appropriate treatment. Patient stated she would need a new Rx because she was informed that she would not be able to breastfeed while on Doxy. Patient stated that she would follow-up with PCP tomorrow and call the [facility] office back.

## 2013-07-19 NOTE — Telephone Encounter (Signed)
Post ED Visit - Positive Culture Follow-up  Culture report reviewed by antimicrobial stewardship pharmacist: [x]  Wes Dulaney, Pharm.D., BCPS []  Celedonio MiyamotoJeremy Frens, Pharm.D., BCPS []  Georgina PillionElizabeth Martin, Pharm.D., BCPS []  [place]acuse[place]h Pham, VermontPharm.D., BCPS, AAHIVP []  Estella HuskMichelle Turner, Pharm.D., BCPS, AAHIVP  Positive wound culture Treated with Clindamycin, organism sensitive to the same and no further patient follow-up is required at this time.  Zeb ComfortHolland, Darrio Bade 07/19/2013, 12:17 PM

## 2013-08-22 IMAGING — US US OB COMP LESS 14 WK
1 series · 14 of 28 positions shown · non-contrast
Comparison: None.

CLINICAL DATA: Pregnant, vaginal bleeding

OBSTETRIC <14 WK US AND TRANSVAGINAL OB US
TECHNIQUE: Both transabdominal and transvaginal ultrasound
examinations were performed for complete evaluation of the
gestation as well as the maternal uterus, adnexal regions, and
pelvic cul-de-sac.  Transvaginal technique was performed to assess
early pregnancy.

[Series 1: us ob comp less 14 wk · 0.21mm/px · 14 of 92 slices shown]
[im 4/92]
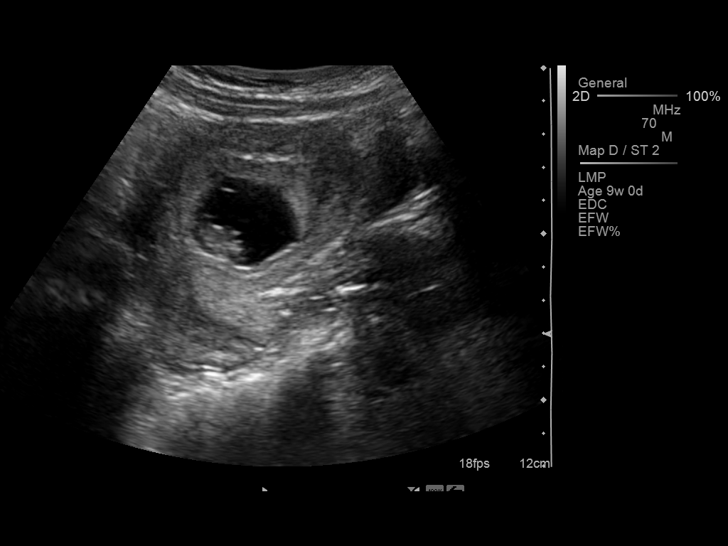
[im 11/92]
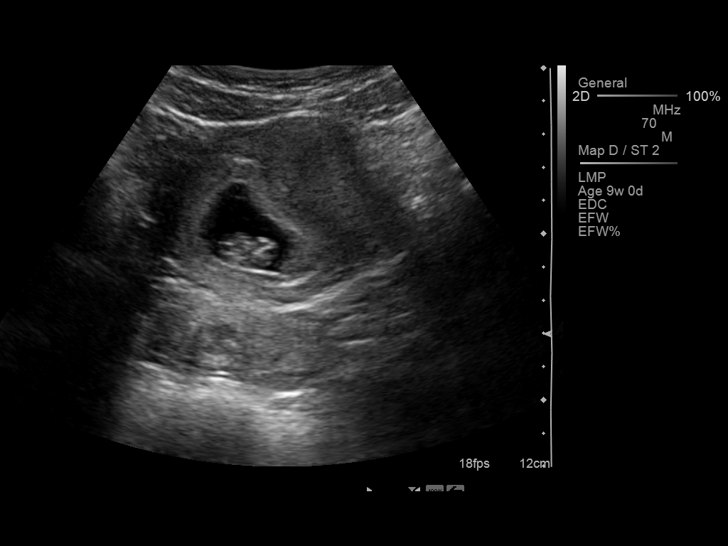
[im 17/92]
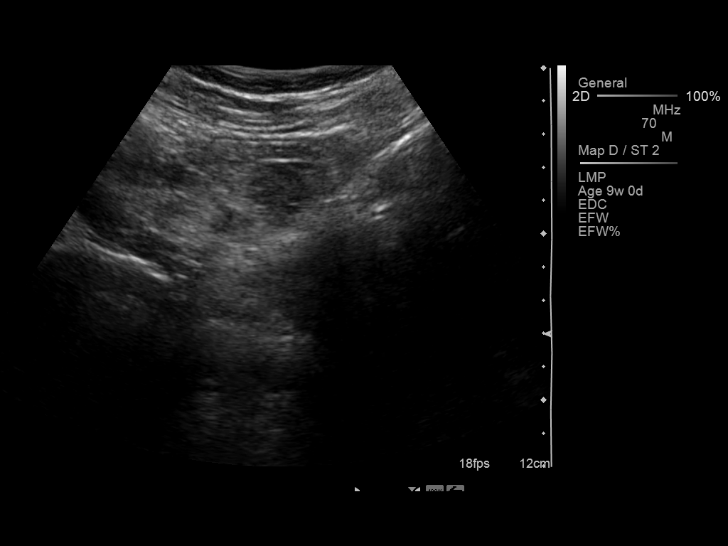
[im 24/92]
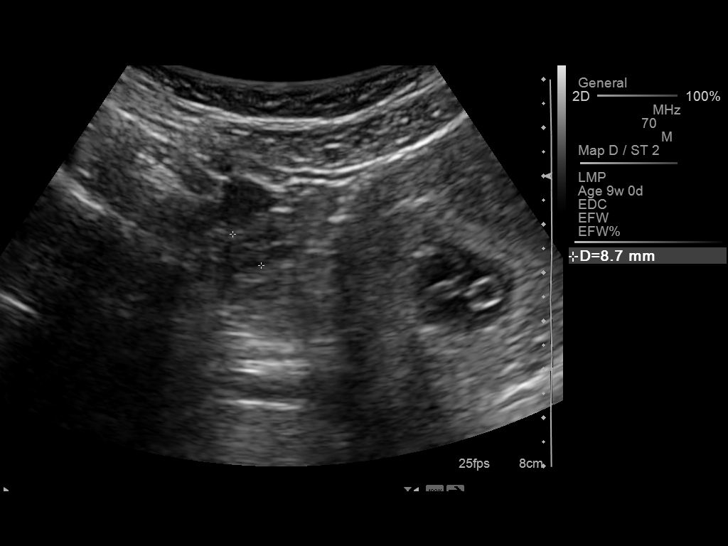
[im 31/92]
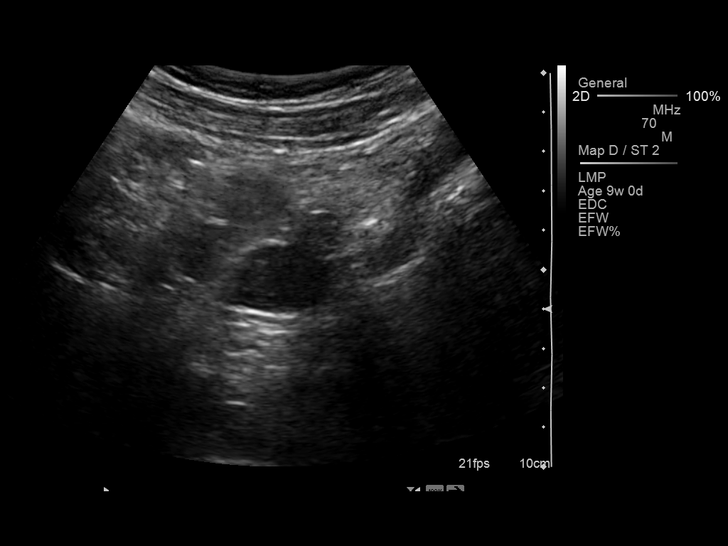
[im 38/92]
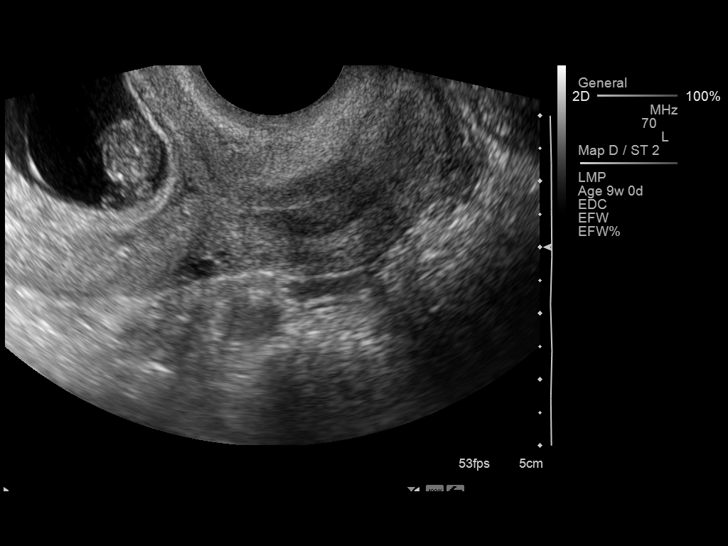
[im 44/92]
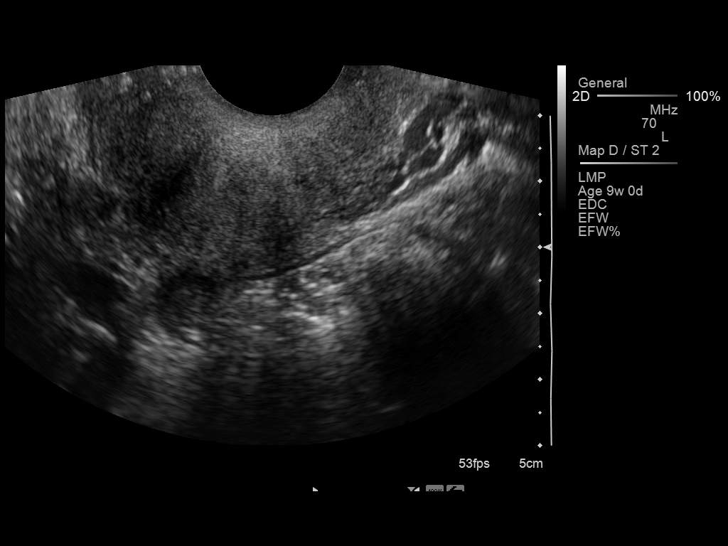
[im 51/92]
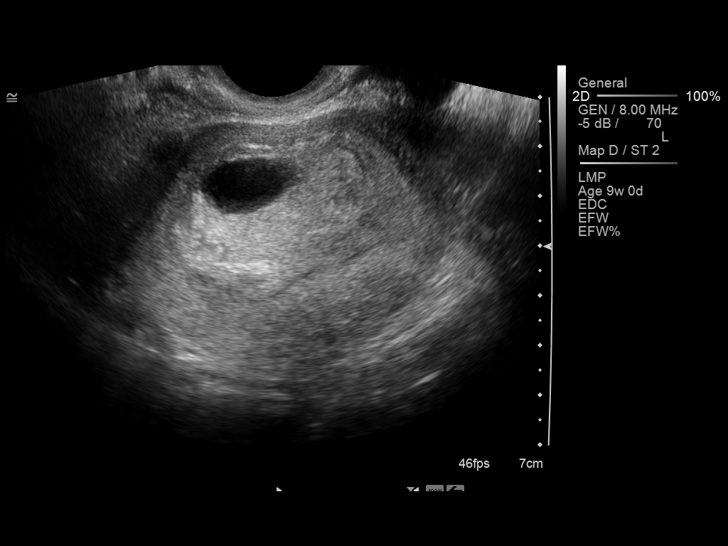
[im 58/92]
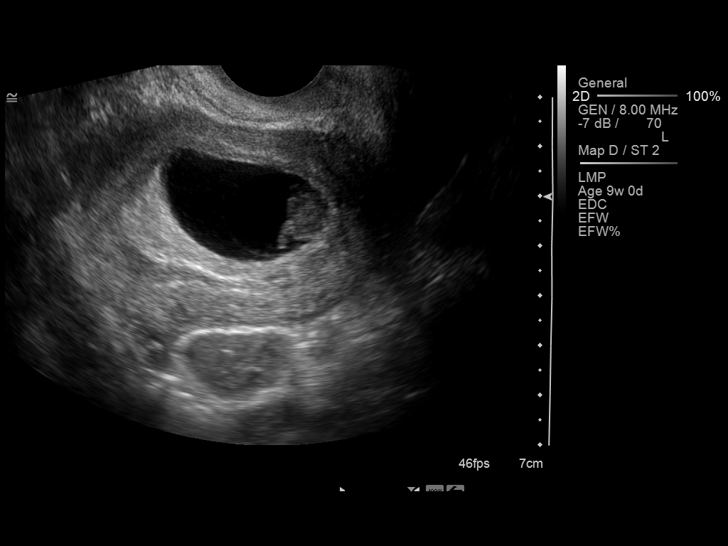
[im 65/92]
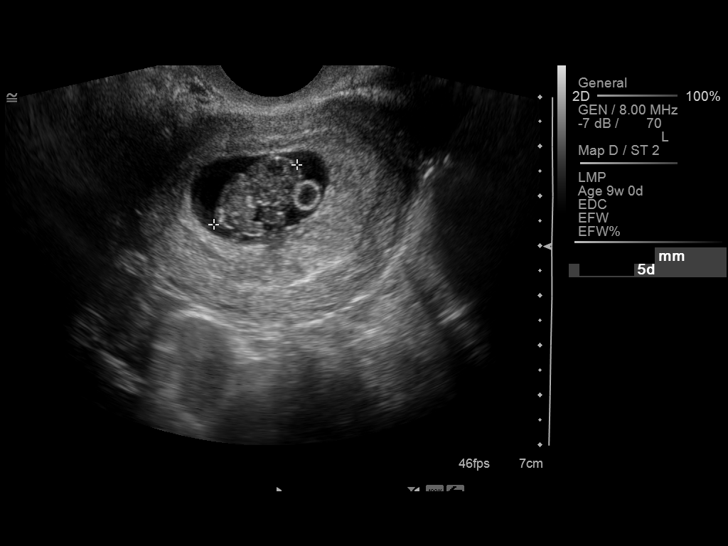
[im 71/92]
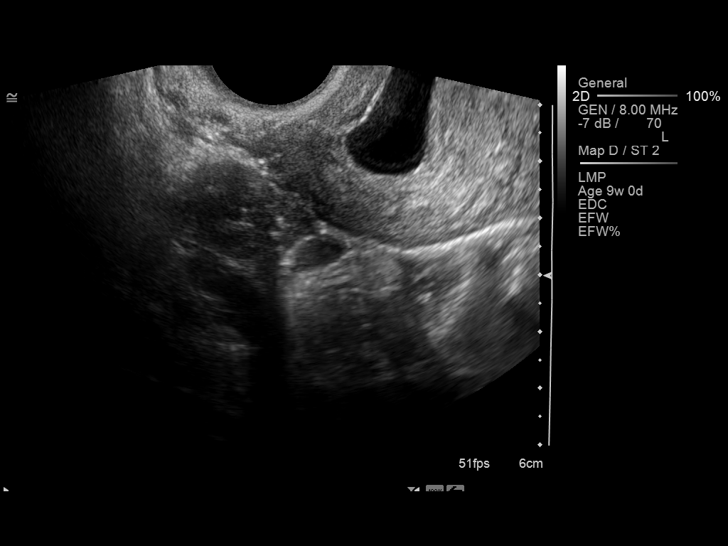
[im 78/92]
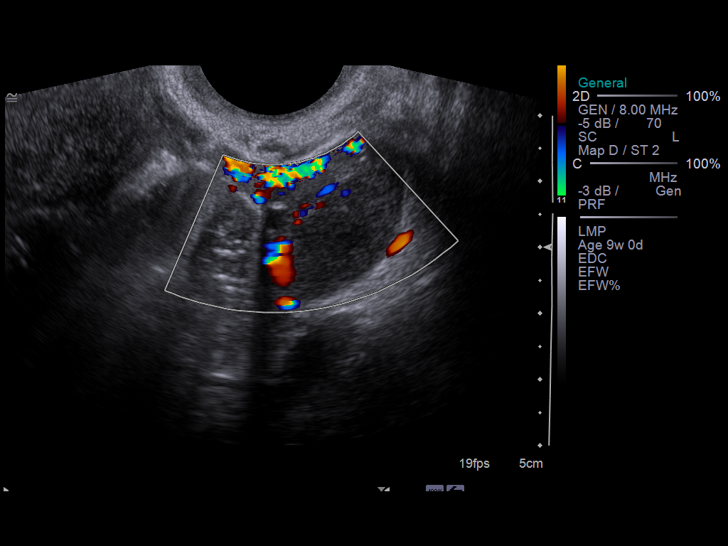
[im 85/92]
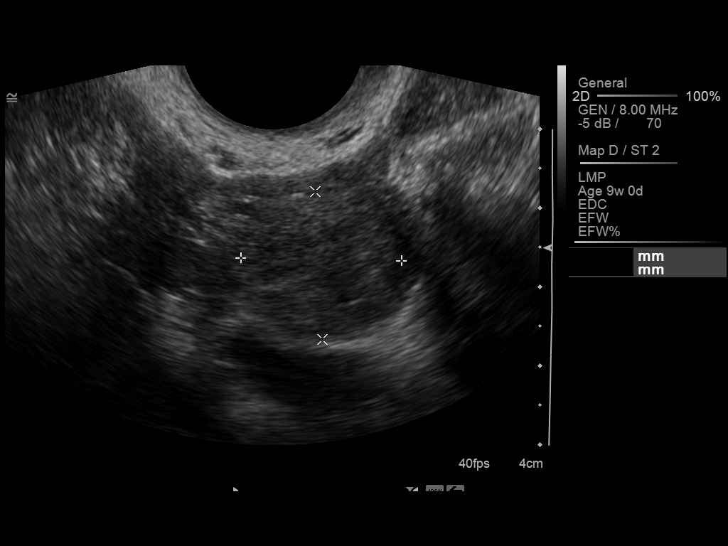
[im 92/92]
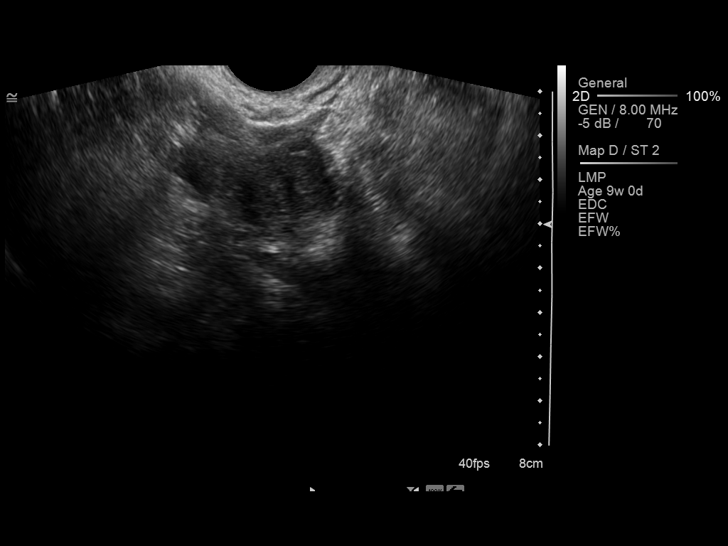

[14 of 28 positions shown; findings below may reference images not displayed]

Intrauterine gestational sac:  Visualized/normal in shape.
Yolk sac: Present
Embryo: Present
Cardiac Activity: Present
Heart Rate: 189 bpm

CRL: 21  mm  8 w  5 d         US EDC: 02/26/2013

Maternal uterus/adnexae:
No subchronic hemorrhage.  Suspected 3.4 x 2.6 x 2.8 cm subserosal
uterine body fibroid.

Right ovary is within normal limits transabdominally, measuring
x 2.7 x 0.9 cm.

Left ovary measures 2.1 x 2.8 x 2.0 cm and is notable for a 2.0 cm
corpus luteal cyst.

No free fluid.
IMPRESSION: Single live intrauterine gestation with estimated gestational age 8
weeks 5 days by crown-rump length.

## 2014-02-10 ENCOUNTER — Emergency Department (INDEPENDENT_AMBULATORY_CARE_PROVIDER_SITE_OTHER)
Admission: EM | Admit: 2014-02-10 | Discharge: 2014-02-10 | Disposition: A | Discharge status: Home / Self Care | Source: Home / Self Care | Attending: Family Medicine | Admitting: Family Medicine

## 2014-02-10 ENCOUNTER — Encounter (HOSPITAL_COMMUNITY): Payer: Self-pay | Admitting: Emergency Medicine

## 2014-02-10 ENCOUNTER — Emergency Department (INDEPENDENT_AMBULATORY_CARE_PROVIDER_SITE_OTHER)

## 2014-02-10 DIAGNOSIS — S90122A Contusion of left lesser toe(s) without damage to nail, initial encounter: Secondary | ICD-10-CM

## 2014-02-10 MED ORDER — IBUPROFEN 800 MG PO TABS: 800.0000 mg | ORAL_TABLET | Freq: Three times a day (TID) | ORAL | Status: AC
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
## 2014-02-10 NOTE — Discharge Instructions (Signed)
Ice, medicine and shoe as needed

## 2014-08-17 IMAGING — US US ABDOMEN COMPLETE
1 series · 14 of 25 positions shown · non-contrast
Comparison: None.

CLINICAL DATA: Pain.

EXAM:
ULTRASOUND ABDOMEN COMPLETE

[Series 1: us abdomen complete · 0.20mm/px · 14 of 69 slices shown]
[im 1/69]
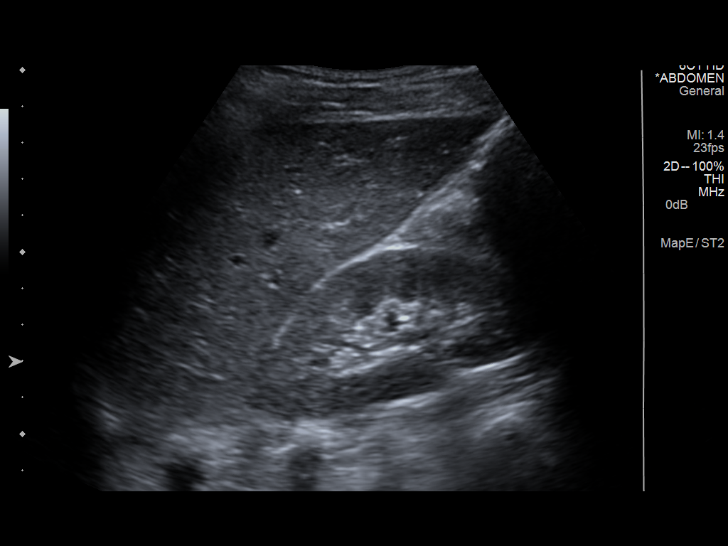
[im 6/69]
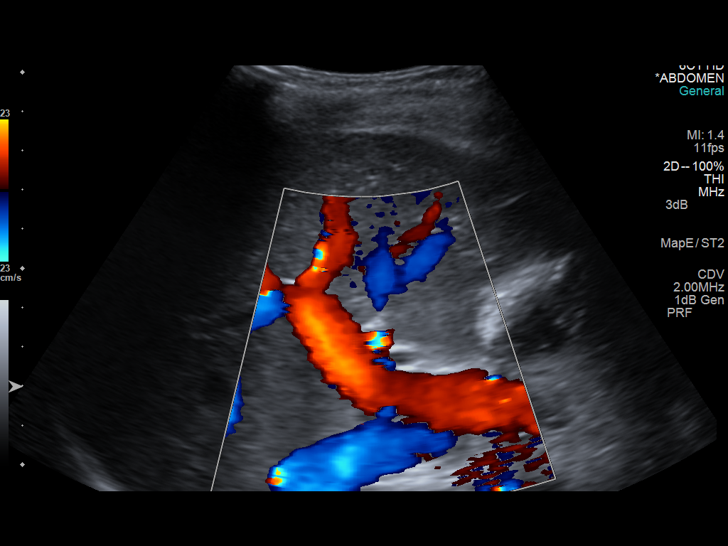
[im 12/69]
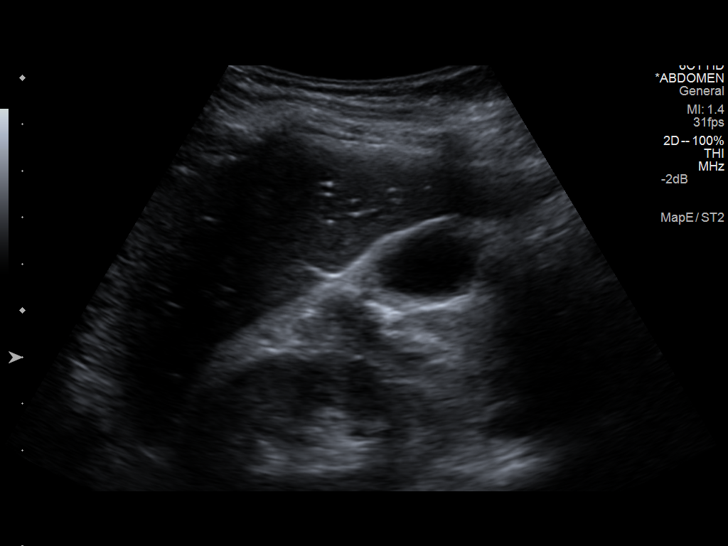
[im 18/69]
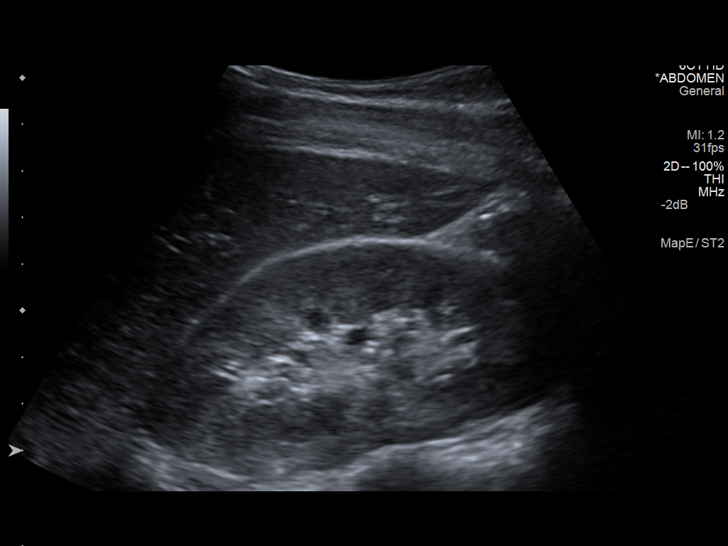
[im 23/69]
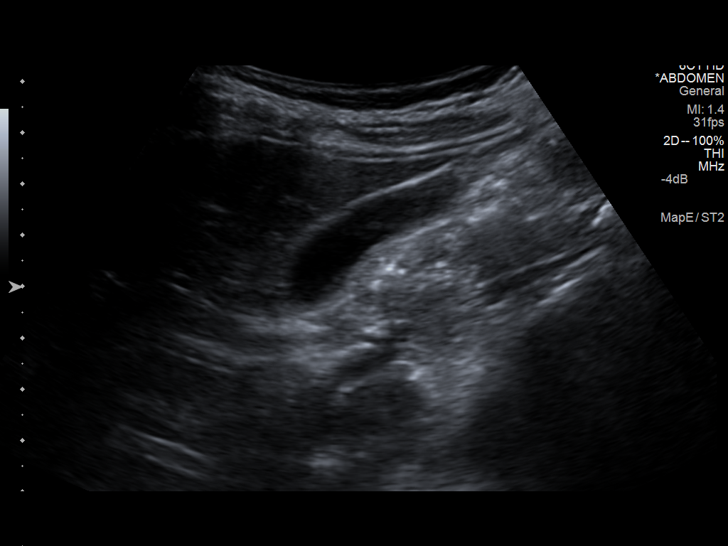
[im 26/69]
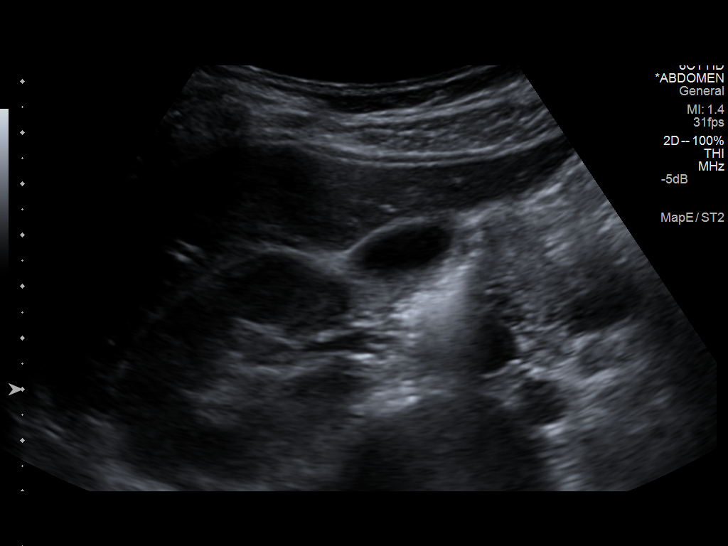
[im 32/69]
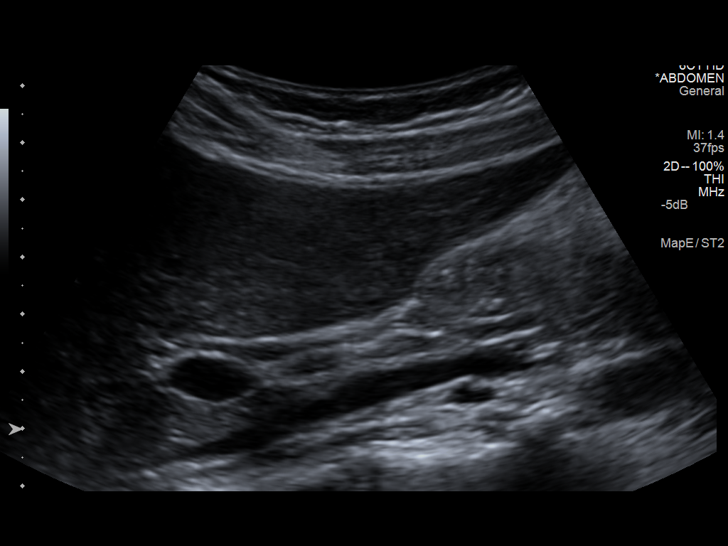
[im 37/69]
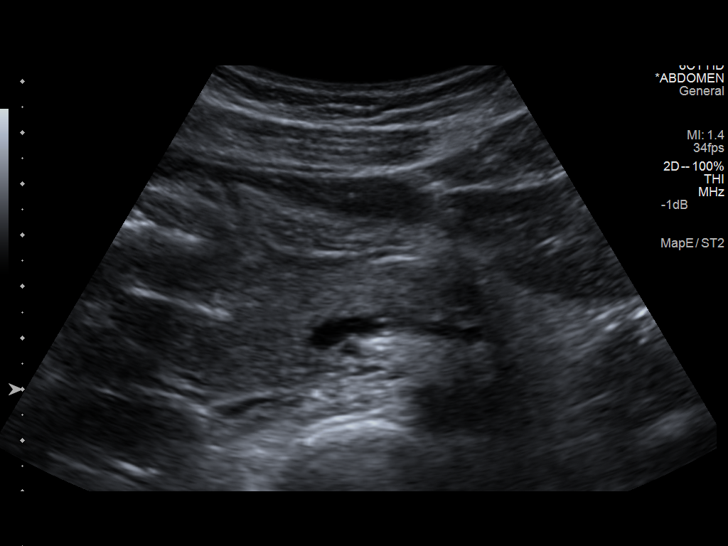
[im 43/69]
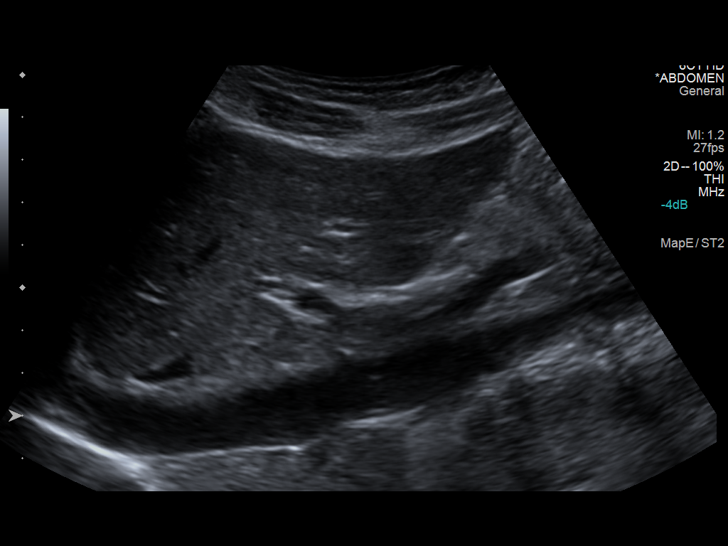
[im 46/69]
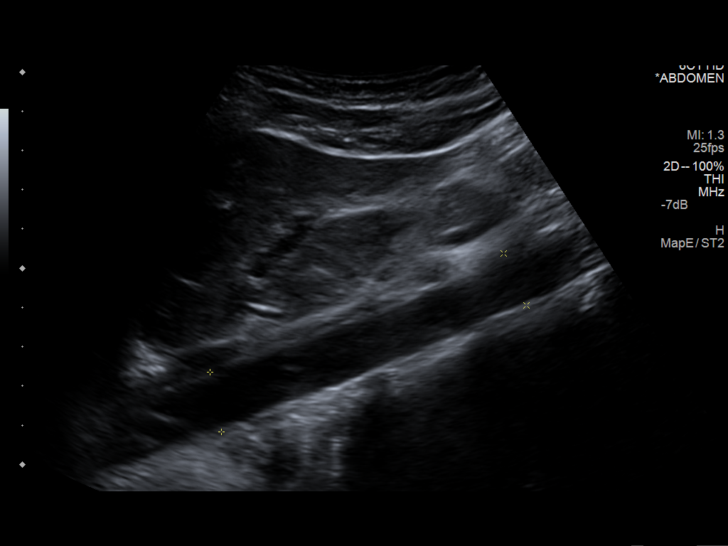
[im 52/69]
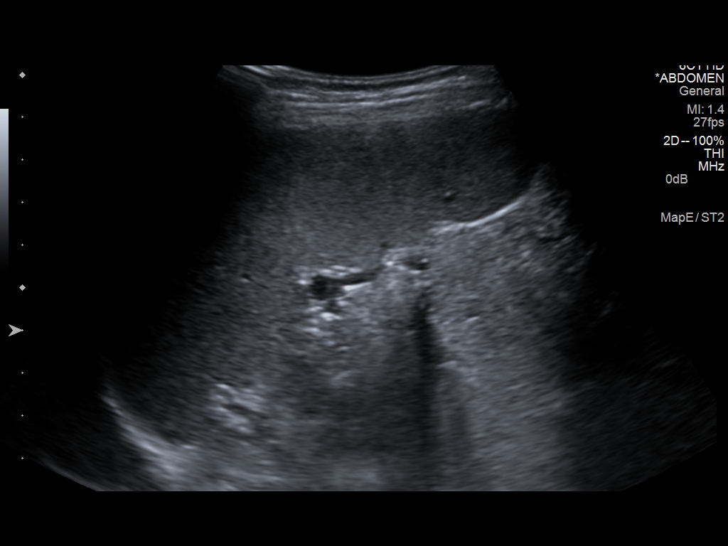
[im 57/69]
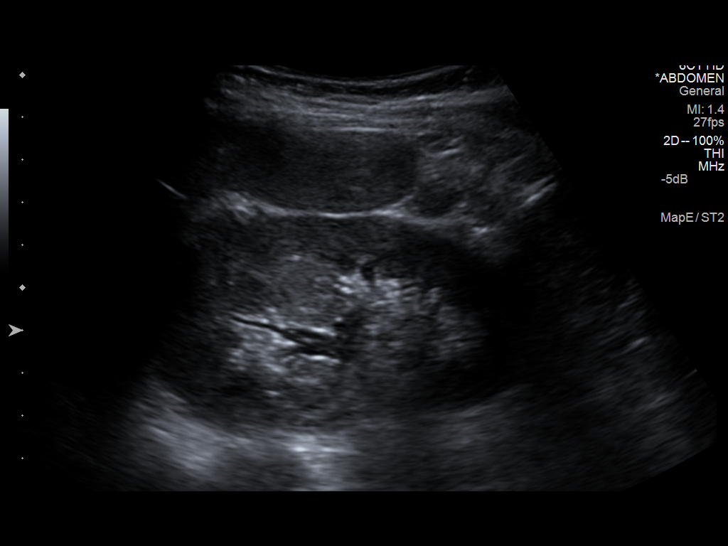
[im 63/69]
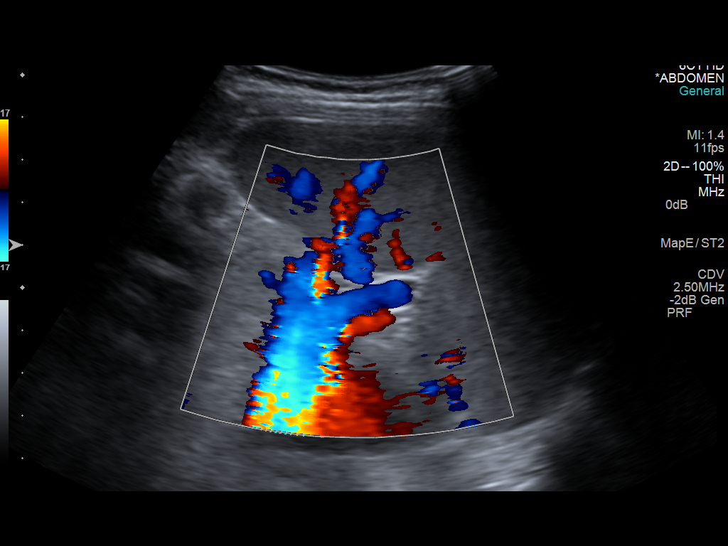
[im 69/69]
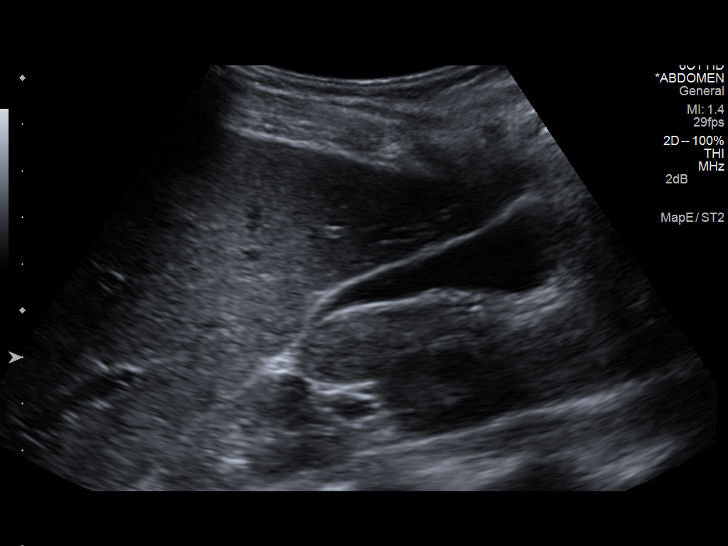

[14 of 25 positions shown; findings below may reference images not displayed]

FINDINGS: Gallbladder:

No gallstones or wall thickening visualized. No sonographic Murphy
sign noted.

Common bile duct:

Diameter: 4.0 mm

Liver:

No focal lesion identified. Within normal limits in parenchymal
echogenicity.

IVC:

No abnormality visualized.

Pancreas:

Visualized portion unremarkable.

Spleen:

Size and appearance within normal limits.

Right Kidney:

Length: 9.9 cm. Echogenicity within normal limits. No mass or
hydronephrosis visualized.

Left Kidney:

Length: 9.9 cm. Echogenicity within normal limits. No mass or
hydronephrosis visualized.

Abdominal aorta:

No aneurysm visualized.

Other findings:

None.
IMPRESSION: No focal abnormality .
